# Patient Record
Sex: Male | Born: 1945 | Race: Black or African American | Hispanic: No | Marital: Single | Smoking: Never smoker
Health system: Southern US, Community
[De-identification: ages and names within clinical notes are randomized; demographics above are authoritative.]

---

## 2000-09-27 ENCOUNTER — Emergency Department (HOSPITAL_COMMUNITY): Admission: EM | Admit: 2000-09-27 | Discharge: 2000-09-28 | Payer: Self-pay | Admitting: Emergency Medicine

## 2001-07-07 ENCOUNTER — Emergency Department (HOSPITAL_COMMUNITY): Admission: EM | Admit: 2001-07-07 | Discharge: 2001-07-07 | Payer: Self-pay | Admitting: Emergency Medicine

## 2001-07-16 ENCOUNTER — Emergency Department (HOSPITAL_COMMUNITY): Admission: EM | Admit: 2001-07-16 | Discharge: 2001-07-16 | Payer: Self-pay | Admitting: Emergency Medicine

## 2002-08-16 ENCOUNTER — Emergency Department (HOSPITAL_COMMUNITY): Admission: EM | Admit: 2002-08-16 | Discharge: 2002-08-16 | Payer: Self-pay | Admitting: Emergency Medicine

## 2002-11-12 ENCOUNTER — Encounter: Payer: Self-pay | Admitting: Urology

## 2002-11-17 ENCOUNTER — Encounter (INDEPENDENT_AMBULATORY_CARE_PROVIDER_SITE_OTHER): Payer: Self-pay

## 2002-11-17 ENCOUNTER — Inpatient Hospital Stay (HOSPITAL_COMMUNITY): Admission: RE | Admit: 2002-11-17 | Discharge: 2002-11-20 | Payer: Self-pay | Admitting: Urology

## 2009-03-30 ENCOUNTER — Emergency Department (HOSPITAL_COMMUNITY): Admission: EM | Admit: 2009-03-30 | Discharge: 2009-03-30 | Payer: Self-pay | Admitting: Emergency Medicine

## 2009-10-28 ENCOUNTER — Ambulatory Visit: Payer: Self-pay | Admitting: Internal Medicine

## 2009-10-28 ENCOUNTER — Encounter: Payer: Self-pay | Admitting: Internal Medicine

## 2009-10-28 DIAGNOSIS — E119 Type 2 diabetes mellitus without complications: Secondary | ICD-10-CM

## 2009-10-28 DIAGNOSIS — J45909 Unspecified asthma, uncomplicated: Secondary | ICD-10-CM | POA: Insufficient documentation

## 2009-10-28 DIAGNOSIS — M25569 Pain in unspecified knee: Secondary | ICD-10-CM

## 2009-10-28 LAB — CONVERTED CEMR LAB
Blood Glucose, Fingerstick: 422
Hgb A1c MFr Bld: 12.3 %

## 2009-10-29 ENCOUNTER — Encounter: Payer: Self-pay | Admitting: Internal Medicine

## 2009-10-29 LAB — CONVERTED CEMR LAB
BUN: 20 mg/dL (ref 6–23)
Calcium: 9.6 mg/dL (ref 8.4–10.5)
Chloride: 101 meq/L (ref 96–112)
Creatinine, Ser: 0.95 mg/dL (ref 0.40–1.50)
Glucose, Bld: 345 mg/dL — ABNORMAL HIGH (ref 70–99)

## 2009-11-01 ENCOUNTER — Encounter: Payer: Self-pay | Admitting: Internal Medicine

## 2010-04-28 NOTE — Miscellaneous (Signed)
Summary: RELEASE OF INFORMATION FORM  RELEASE OF INFORMATION FORM   Imported By: Louretta Parma 11/04/2009 14:11:02  _____________________________________________________________________  External Attachment:    Type:   Image     Comment:   External Document

## 2010-04-28 NOTE — Miscellaneous (Signed)
Summary: PATIENT CONSENT FORM  PATIENT CONSENT FORM   Imported By: Louretta Parma 10/29/2009 12:13:55  _____________________________________________________________________  External Attachment:    Type:   Image     Comment:   External Document

## 2010-04-28 NOTE — Assessment & Plan Note (Signed)
Vital Signs:  Patient profile:   65 year old male Height:      70.75 inches (179.71 cm) Weight:      211.0 pounds (95.91 kg) BMI:     29.74 Temp:     98.5 degrees F (36.94 degrees C) oral Pulse rate:   106 / minute BP sitting:   154 / 92  (right arm)  Vitals Entered By: Stanton Kidney Ditzler RN (October 28, 2009 3:35 PM) Is Patient Diabetic? Yes Did you bring your meter with you today? No Pain Assessment Patient in pain? yes     Location: right knee Intensity: 8 Type: aching Onset of pain  past 3 weeks Nutritional Status BMI of 25 - 29 = overweight Nutritional Status Detail appetite good CBG Result 422  Have you ever been in a relationship where you felt threatened, hurt or afraid?denies   Does patient need assistance? Functional Status Self care Ambulation Normal Comments New pt to clinic - lost insurance and out of meds. Will use $4.00 meds at [facility] till he get [employer]. BP reck 4:30PM right arm 130/85 pulse 102.   History of Present Illness: 65 yo male with PMH of DM type II dx 7-8 yrs ago, asthma presents for medication refills and right knee pain.  For his asthma, he is using Advair 2 puffs daily and denies any SOB and has not had a flare in a long time.  For his DM, he was taking actos , metformin 500mg  bid , lipitor, never been on insulin before.  He takes advil or aspirin occasionally for headache. No numbness or tingling.  Been out of medications 2 months.  BS at home usually 120-125. Knee pain started about 1.5 wks ago.   Right knee  swells up a couple of days ago but it went down after rubbing alcohol.  No injury or trauma, hard to get up and feels stiff in the morning. After he moves around during the day, it feels better.  If he sits down for a period of time, it hurts when he gets up.    First BP on arrival was154/92.   Recheck BP was 135/84.  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   podiatry- 5 yrs ago  Dr. Chaya Jan   Allergic:  NKDA       Depression History:      The patient denies a depressed mood most of the day and a diminished interest in his usual daily activities.         Preventive Screening-Counseling & Management  Alcohol-Tobacco     Smoking Status: never  Allergies (verified): No Known Drug Allergies  Past History:  Past Medical History: Asthma -dx 12 yrs ago DM type II- dx 7-8 yrs ago Prostate cancer- Prostate removal 6 yrs ago by Dr. Margarite Gouge?  Family History: Mother: died of breast cancer Father: died of cancer , unknown type 3 siblings: DM type II  Social History: unemployed, live with wife, 2 healthy children, denies any smoking, alcohol, illicit drugs.  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  Physical Exam  General:  alert, well-developed, well-nourished, and well-hydrated.   Lungs:  normal respiratory effort, no intercostal retractions, no accessory muscle use, normal breath sounds, no crackles, and no wheezes.   Heart:  normal rate, regular rhythm, no murmur, no gallop, and no JVD.   Abdomen:  soft, non-tender, normal bowel sounds, no distention, no masses, and no guarding.   Msk:  normal ROM.  Right knee tenderness, no warmth or swelling, or erythema.  Passive ROM 70 degree, + crepitation. Pulses:  R radial normal and R popliteal normal.   Extremities:  no edema  Neurologic:  alert & oriented X3 and cranial  nerves II-XII intact.     Impression & Recommendations:  Problem # 1:  DM (ICD-250.00) Assessment Deteriorated  Uncontrolled DM type II.  CBG today 422 with HbA1c 12.3.  Patient ran out of his medications 2 months ago.  He stated that his HbA1c was in the 7 range when he saw Dr. Valentina Lucks earlier this year and was on Metformin 500mg  bid and Actos.  Patient does not wish to restart Actos.  We will restart Metformin 500mg  and titrate up to 1000mg  two times a day in 4 weeks.  Please see titrating dosage below.   Will also start Glipizide 10mg  by mouth daily Will wait to check his microalbumin once his blood sugar is adequately controlled. I will check his BMP today for basic labs and Cr while defer other lab tests since patient does not have insurance at this moment. Will get Medical records from his previous PCP.  His updated medication list for this problem includes:    Metformin Hcl 500 Mg Tabs (Metformin hcl) .Marland Kitchen... 1 tablet po with supper x 7 days, then 1 tablet with breakfast and supper x 1wk, 1 tablet with breakfast and 2 tablets with supper x1wk,then 2 tab by two times daily    Glipizide Xl 10 Mg Xr24h-tab (Glipizide) .Marland Kitchen... 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  Patient reports improving pain and swelling with alcohol rubs.  I advised patient to use heating or ice pads for pain and swelling of his knee.  He can also take tylenol or ibuprofen as needed for pain.  If symptoms do not improve, I will send for Xray for further evaluation.  Willl check his Cr level today to make sure his kidneys can tolerate NSAIDs.   Patient will follow up with me in 2 weeks  Orders: T-Basic Metabolic Panel [id number])  Problem # 3:  ASTHMA, UNSPECIFIED, UNSPECIFIED STATUS (ICD-493.90) Assessment: Unchanged Stable on Advair.   His lungs were clear to auscultate, no wheezes.  I gave him 1 sample of Advair and advised him to fill out medication assistance form online since patient does not have insurance and is not qualified for orange card.   May consider Albuterol if he cannot afford Advair.  His updated medication list for this problem includes:    Advair Diskus 250-50 Mcg/dose Aepb (Fluticasone-salmeterol) .Marland Kitchen... 1 inhalation two times a day  Complete Medication List: 1)  Advair Diskus 250-50 Mcg/dose Aepb (Fluticasone-salmeterol) .Marland Kitchen.. 1 inhalation two times a day 2)  Metformin Hcl 500 Mg Tabs (Metformin hcl) .Marland Kitchen.. 1 tablet po with supper x 7 days, then 1 tablet with breakfast and supper x 1wk, 1 tablet with breakfast and 2 tablets with supper x1wk,then 2 tab by two times  daily 3)  Glipizide Xl 10 Mg Xr24h-tab (Glipizide) .Marland Kitchen.. 1 tablet by mouth daily  Patient Instructions: 1)  1. Take Metformin as followed: 2)   Wk 1: 500mg  with supper 3)  Wk 2: 500mg  with breakfast and supper 4)  Wk 3: 500mg  with breakfast and 1000mg  with supper 5)  Wk 4: 1000 mg with breakfast and 1000mg  with supper 6)  2. Start taking Glipizide 10mg  1 tablet daily 7)  3. Use Advair for asthma  8)  4. Fill out medication assistance form online for Advair 9)  5. Take Tylenol or ibuprofen for knee pain as needed, can also try heating or ice pads 10)  6. Follow up with Dr. Anselm Jungling in 2 weeks Prescriptions: METFORMIN HCL 500 MG TABS (METFORMIN HCL) 1 tablet po with supper x 7 days, then 1 tablet with breakfast and supper x 1wk, 1 tablet with breakfast and 2 tablets with supper x1wk,then 2 tab by two times daily  #70 x 3   Entered and Authorized by:   Rosana Berger MD   Signed by:   Rosana Berger MD on 10/28/2009   Method used:   Print then Give to Patient   RxID:   [id number] GLIPIZIDE XL 10 MG XR24H-TAB (GLIPIZIDE) 1 tablet by mouth daily  #30 x 3   Entered and Authorized by:   Rosana Berger MD   Signed by:   Rosana Berger MD on 10/28/2009   Method  used:   Electronically to        [facility] [phone]* (retail)       [address]       [place], [place]  [address]       Ph: [phone]       Fax: [phone]   RxID:   [id number] GLIPIZIDE XL 10 MG XR24H-TAB (GLIPIZIDE) 1 tablet by mouth daily  #30 x 3   Entered and Authorized by:   Rosana Berger MD   Signed by:   Rosana Berger MD on 10/28/2009   Method used:   Electronically to        [facility] [phone]* (retail)       [address]       [place], [place]  [address]       Ph: [phone]       Fax: [phone]   RxID:   [id number] ADVAIR DISKUS 250-50 MCG/DOSE AEPB (FLUTICASONE-SALMETEROL) 1 inhalation two times a day  #1 x 0   Entered and Authorized by:   Rosana Berger MD   Signed by:   Rosana Berger MD on 10/28/2009   Method used:   Electronically to        [facility] [phone]* (retail)       [address]       [place], [place]  [address]       Ph: [phone]       Fax: [phone]   RxID:   [id number]  Process Orders Check Orders Results:     [facility]: ABN not required for this insurance Tests Sent for requisitioning (October 28, 2009 5:41 PM):     10/28/2009: [facility] -- T-Basic Metabolic Panel [id number] (signed)     Laboratory Results   Blood Tests   Date/Time Received: October 28, 2009 3:59 PM Date/Time Reported: Burke Keels  October 28, 2009 4:00 PM    HGBA1C: 12.3%   (Normal Range: Non-Diabetic - 3-6%   Control Diabetic - 6-8%)  CBG Random:: 422mg /dL  Comments: per patient, out of meds for approx 2 months    Appended Document:  I saw and examined Mr Molesworth with Dr Anselm Jungling. I agree with her note above. Mr Million does not have insurance and household income too much for orange card. Dr Anselm Jungling changed meds to $4 plan. Advair will be the only difficult med to obtain. No statin for now - get old records and check FLP soon. Dr Rogelia Boga

## 2010-06-09 ENCOUNTER — Encounter: Payer: Self-pay | Admitting: Internal Medicine

## 2010-06-09 DIAGNOSIS — C61 Malignant neoplasm of prostate: Secondary | ICD-10-CM | POA: Insufficient documentation

## 2010-06-11 LAB — GLUCOSE, CAPILLARY: Glucose-Capillary: 167 mg/dL — ABNORMAL HIGH (ref 70–99)

## 2010-08-01 ENCOUNTER — Inpatient Hospital Stay (INDEPENDENT_AMBULATORY_CARE_PROVIDER_SITE_OTHER)
Admission: RE | Admit: 2010-08-01 | Discharge: 2010-08-01 | Disposition: A | Discharge status: Home / Self Care | Payer: Self-pay | Source: Ambulatory Visit | Attending: Emergency Medicine | Admitting: Emergency Medicine

## 2010-08-01 DIAGNOSIS — S61409A Unspecified open wound of unspecified hand, initial encounter: Secondary | ICD-10-CM

## 2010-08-12 NOTE — Discharge Summary (Signed)
NAMEGREIG, ALTERGOTT                            ACCOUNT NO.:  [id number]   MEDICAL RECORD NO.:  [id number]                   PATIENT TYPE:  INP   LOCATION:  0359                                 FACILITY:  [facility]   PHYSICIAN:  Valetta Fuller, M.D.               DATE OF BIRTH:  11/05/1945   DATE OF ADMISSION:  11/17/2002  DATE OF DISCHARGE:  11/20/2002                                 DISCHARGE SUMMARY   DISCHARGE DIAGNOSES:  1. 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  2. Postoperative anemia.  3. Hypertension.  4. Type 2 diabetes mellitus.   PROCEDURE PERFORMED:  Pelvic lymph node dissection and radical retropubic  prostatectomy on November 17, 2002.   HOSPITAL COURSE:  [name] is a 65 year old male.  He had elevated PSA in  the 4-5 range with a markedly reduced PSA-2 gradient.  Biopsies revealed  left-sided adenocarcinoma with a Gleason score of 4+3=7.  The patient  underwent extensive counseling and elected to have a pelvic lymph node  dissection, radical retropubic prostatectomy.  Of note, the patient's  preoperative hemoglobin was 11.6.   The patient underwent relatively uneventful pelvic lymph node dissection and  radical retropubic prostatectomy.  There was no evidence of disease outside  the prostate.  Estimated blood loss for his surgery was approximately 1000  mL.  In the immediate postoperative period he did have a little emesis.  His  initial postoperative hemoglobin was 8.9.  He continued to be  hemodynamically stable.  His urine output remained excellent and JP drainage  was minimal.  On postoperative day one his hemoglobin was down to 8.0 and  again, he remained hemodynamically stable without any lightheadedness or  dizziness.  The patient continued to have minimal JP output.  The patient  was able to resume general diet by postoperative day two.  He had low grade  fever under 101 thought to be due to atelectasis.  On postoperative day  three his  hemoglobin had drifted down to 6.9.  The patient again was  somewhat anemic preoperatively with a normal MCV, but a hemoglobin of  approximately 11.  We felt that given his preoperative anemia that he may be  slow to produce red blood cells and we felt it would probably be safer given  his cardiac history to go ahead and transfuse 2 units of packed blood cells.  That was done without complication.  The patient's follow-up pathology  revealed bilateral Gleason 4+3=7 prostate cancer.  There was no evidence of  margin positive or capsular extension.   DISPOSITION:  The patient was discharged home with Colace, Vicodin, and  iron.  He will have a CBC drawn in a couple of days as an outpatient.  He  will come back in one week for staple removal and in approximately 10 days  for Foley catheter removal.  Valetta Fuller, M.D.    DSG/MEDQ  D:  12/16/2002  T:  12/16/2002  Job:  [id number]

## 2010-08-12 NOTE — H&P (Signed)
NAMEJEROL, [name]                            ACCOUNT NO.:  [id number]   MEDICAL RECORD NO.:  [id number]                   PATIENT TYPE:  INP   LOCATION:  X006                                 FACILITY:  [facility]   PHYSICIAN:  Valetta Fuller, M.D.               DATE OF BIRTH:  [birth year]   DATE OF ADMISSION:  11/17/2002  DATE OF DISCHARGE:                                HISTORY & PHYSICAL   CHIEF COMPLAINT:  Clinical stage T1C adenocarcinoma of the prostate for  pelvic lymph node dissection and radical retropubic prostatectomy.   HISTORY OF PRESENT ILLNESS:  [name] is a 65 year old male.  During a  routine physical exam, he was noted to have a PSA in the 4 to 5 range.  His  PSA2 reading was only 7%.  We recommended further evaluation.  Digital  rectal exam was unremarkable and his prostate was relatively small.  The  patient did undergo ultrasound and biopsy.  This revealed a 20g prostate.  The left-sided biopsies were positive for a Gleason's 4+3=7 adenocarcinoma  of the prostate.  While his PSA was quite low, the Gleason's 4 component of  his prostate cancer is potentially more worrisome.  He is also young and has  relatively good health.  We recommended pelvic lymph node dissection and  radical retropubic prostatectomy.  The patient appeared to understand those  advantages and disadvantages and elected to have surgical intervention.   PAST MEDICAL HISTORY:  Significant for diabetes mellitus.  This is non  insulin requiring.  He is on Glucophage, Altace, Avandia, Glucotrol and is  also an asthmatic that uses Advair and albuterol.  He has no drug allergies.   PAST SURGICAL HISTORY:  Unremarkable.   SOCIAL HISTORY:  He is a nonsmoker.   FAMILY HISTORY:  Positive for diabetes and negative for prostate cancer.   REVIEW OF SYSTEMS:  Relatively unremarkable with no significant voiding or  erectile difficulties.   PHYSICAL EXAMINATION:  GENERAL:  He is a well-developed,  well-nourished  male.  VITAL SIGNS:  His blood pressure was 164/92 with a pulse of 80.  He is  afebrile.  HEENT:  Neck is without masses or jugular venous distention.  CHEST:  Clear to auscultation.  ABDOMEN:  Soft and flat.  GU:  External genitalia is within normal limits with a 1+ prostate.  EXTREMITIES:  No abnormality.   LABORATORY DATA:  BMET is within normal limits.  Hemoglobin was somewhat  reduced at 11.6.  He appeared to have a mild normocytic anemia.    ASSESSMENT OF CLINICAL STATE:  T1C adenocarcinoma of the prostate.  The  patient is to have pelvic lymph node dissection, radical retropubic  prostatectomy today. Hopefully he will be admitted for routine postoperative  care.  Valetta Fuller, M.D.    DSG/MEDQ  D:  11/17/2002  T:  11/17/2002  Job:  [id number]   cc:   [name], M.D.  [address]  [place]  [place] [address]  Fax: [phone]

## 2010-08-12 NOTE — Op Note (Signed)
[name], [name]                            ACCOUNT NO.:  [id number]   MEDICAL RECORD NO.:  [id number]                   PATIENT TYPE:  INP   LOCATION:  Z610                                 FACILITY:  [facility]   PHYSICIAN:  Valetta Fuller, M.D.               DATE OF BIRTH:  Jul 11, 1945   DATE OF PROCEDURE:  11/17/2002  DATE OF DISCHARGE:                                 OPERATIVE REPORT   PREOPERATIVE DIAGNOSIS:  Clinical stage T1C adenocarcinoma of the prostate.   POSTOPERATIVE DIAGNOSES:  Clinical stage T1C adenocarcinoma of the prostate.   PROCEDURE:  Bilateral pelvic lymph node dissection and radical retropubic  prostatectomy.   SURGEON:  Valetta Fuller, M.D.   ASSISTANT:  [name], M.D.   ANESTHESIA:  General endotracheal.   INDICATIONS FOR PROCEDURE:  [name] is a 65 year old male who was recently  diagnosed with clinical stage T1C adenocarcinoma of the prostate. His PSA  was in the 4-5 range with a markedly reduced PSA 2 reading. He ended up  having left sided Gleason 7 (4+3 adenocarcinoma of the prostate). The  patient underwent extensive counseling with regard to treatment options. He  does have type 2 diabetes and mild hypertension but has really been well  managed medically and appears to be in otherwise relatively good health.  Again, he and the family and I had an extensive discussion with regard to  treatment options. He elected to have a radical retropubic prostatectomy,  pelvic lymph node dissection. He appeared to understand the advantages,  disadvantages and potential complications of this problem.   TECHNIQUE:  The patient was brought to the operating room where he had  successful induction of general endotracheal anesthesia. He was placed in  the supine position with a slight break in the table. He was then prepped  and draped in the usual manner. Under sterile conditions, a Foley catheter  was placed. A standard lower midline incision was  performed. The retropubic  space was entered with blunt dissective technique and an Omni retractor was  used for visualization. Bilateral pelvic lymph node dissections were then  done of the obturator packets. The obturator nerves were identified and  spared bilaterally. Small vessels and lymphatics were clipped or cauterized.  No palpably enlarged or visibly enlarged nodes were encountered.   Attention was then turned towards the prostatectomy. The endopelvic fascia  was opened bilaterally. Careful blunt dissection of the under surface of the  pubic symphysis revealed the bilateral puboprostatic ligaments which were  sharply dissected. We were then able to pass a large right angled clamp  between the urethra and dorsal vein complex which was doubly ligated and  then suture ligated. It was then transected. The underlying urethral stump  was clearly identified. Some of the lateral neurovascular bundle tissue was  dissected off the urethral stump which was then carefully encircled and  transected anteriorly. The  Foley catheter was then identified and brought  out through the wound. The posterior wall of the urethra was then transected  again leaving a nice urethral stump. We had to sharply dissect some  rectourethralis tissue and then the posterior plane was very easily  established. We did not palpate any obvious tumor near the neurovascular  bundle and felt it appropriate to consider him for bilateral nerve sparing  prostatectomy. The endopelvic fascia along the lateral aspect of the  prostate was sharply dissected on both sides. We were then able to identify  the seminal vesicles and vas in the midline. Using that as our guide, we  were able to take down the lateral pedicles of the prostate primarily  utilizing Hem-o-lok clips. We then dissected out the vas deferens and  seminal vesicle posteriorly. We then turned our attention to the bladder  neck.   The bladder neck dissection was  done with a combination of sharp and blunt  dissective technique preserving the circular fibers of the bladder neck.  Posteriorly we were then able to again identify the vas which were clipped  and transected in the seminal vesicles which were taken out with the  specimen. The bladder neck did not require reconstruction but the mucosa was  everted with several 4-0 Vicryl sutures. A urethral sound was placed and the  urethral stump was identified. Five separate 2-0 Vicryl double armed sutures  were utilized in an anatomic manner. They were then placed in the bladder  neck in corresponding positions and this was done over a 22 [place] Foley.  The anastomosis was then tied and irrigated. No obvious leakage occurred.  The whole pelvis was copiously irrigated. A Jackson-Pratt drain was placed  in the retropubic space. The fascia was closed with #1 PDS. The skin was  closed with clips. The Jackson-Pratt was left to bulb suction and the  catheter was left to gravity drainage. Marcaine was infiltrated in the  wound. The patient appeared to tolerate the procedure well. Sponge and  needle counts were correct. Estimated blood loss was approximately 1000 mL.                                               Valetta Fuller, M.D.    DSG/MEDQ  D:  11/17/2002  T:  11/17/2002  Job:  [id number]   cc:   Thora Lance, M.D.  [address]  [place]  [place] [address]  Fax: [phone]

## 2012-12-16 ENCOUNTER — Encounter (HOSPITAL_COMMUNITY): Payer: Self-pay | Admitting: Emergency Medicine

## 2012-12-16 ENCOUNTER — Emergency Department (HOSPITAL_COMMUNITY)
Admission: EM | Admit: 2012-12-16 | Discharge: 2012-12-16 | Disposition: A | Discharge status: Home / Self Care | Payer: Medicare HMO | Attending: Emergency Medicine | Admitting: Emergency Medicine

## 2012-12-16 DIAGNOSIS — Z79899 Other long term (current) drug therapy: Secondary | ICD-10-CM | POA: Insufficient documentation

## 2012-12-16 DIAGNOSIS — J45909 Unspecified asthma, uncomplicated: Secondary | ICD-10-CM | POA: Insufficient documentation

## 2012-12-16 DIAGNOSIS — Y9389 Activity, other specified: Secondary | ICD-10-CM | POA: Insufficient documentation

## 2012-12-16 DIAGNOSIS — S90569A Insect bite (nonvenomous), unspecified ankle, initial encounter: Secondary | ICD-10-CM | POA: Insufficient documentation

## 2012-12-16 DIAGNOSIS — Y9289 Other specified places as the place of occurrence of the external cause: Secondary | ICD-10-CM | POA: Insufficient documentation

## 2012-12-16 DIAGNOSIS — W57XXXA Bitten or stung by nonvenomous insect and other nonvenomous arthropods, initial encounter: Secondary | ICD-10-CM

## 2012-12-16 DIAGNOSIS — E119 Type 2 diabetes mellitus without complications: Secondary | ICD-10-CM | POA: Insufficient documentation

## 2012-12-16 LAB — GLUCOSE, CAPILLARY: Glucose-Capillary: 292 mg/dL — ABNORMAL HIGH (ref 70–99)

## 2012-12-16 NOTE — ED Notes (Signed)
Bitten by something last Tuesday on left thigh now red and swollen

## 2012-12-16 NOTE — ED Notes (Signed)
Provider at the bedside to mark reddened area on leg.

## 2012-12-16 NOTE — ED Provider Notes (Signed)
CSN: [id number]     Arrival date & time 12/16/12  0801 History   First MD Initiated Contact with Patient 12/16/12 [phone]     Chief Complaint  Patient presents with  . Leg Pain   (Consider location/radiation/quality/duration/timing/severity/associated sxs/prior Treatment) The history is provided by the patient. No language interpreter was used.  Tramel Westbrook is a 67 y/o M with PMHx of asthma and DM presenting to the ED with bite to the inner aspect of his left thigh. Patient reported that he was working in the yard last Tuesday, wearing long pants, stated that he noticed a red lesion to the inner aspect of the left thigh. Reported that there was no pain or itching. Reported that he applied rubbing alcohol. Patient reported that it was red at first, but has now changed to a purplish color with red dots on it. Patient reported that there is a mild stinging sensation with radiation to the left hip. Reported that his sugars are controlled, reported that it was 125 this morning. Denied fever, chills, drainage, bleeding, numbness, tingling, pain with palpation, loss of sensation, weakness, pain with walking, chest pain, shortness of breath, difficulty breathing. PCP Dr. Kirby Funk  Past Medical History  Diagnosis Date  . Asthma   . Diabetes mellitus    History reviewed. No pertinent past surgical history. Family History  Problem Relation Age of Onset  . Cancer Father   . Diabetes Sister   . Diabetes Brother   . Cancer Mother    History  Substance Use Topics  . Smoking status: Never Smoker   . Smokeless tobacco: Not on file  . Alcohol Use: No    Review of Systems  Constitutional: Negative for fever and chills.  HENT: Negative for neck pain and neck stiffness.   Eyes: Negative for visual disturbance.  Respiratory: Negative for chest tightness and shortness of breath.   Cardiovascular: Negative for chest pain.  Gastrointestinal: Negative for nausea, vomiting and abdominal pain.   Genitourinary: Negative for decreased urine volume and difficulty urinating.  Skin: Positive for rash.  Neurological: Negative for dizziness, weakness, numbness and headaches.  All other systems reviewed and are negative.    Allergies  Review of patient's allergies indicates no known allergies.  Home Medications   Current Outpatient Rx  Name  Route  Sig  Dispense  Refill  . Fluticasone-Salmeterol (ADVAIR DISKUS) 250-50 MCG/DOSE AEPB   Inhalation   Inhale 1 puff into the lungs every 12 (twelve) hours.           Marland Kitchen glipiZIDE (GLUCOTROL) 10 MG 24 hr tablet   Oral   Take 10 mg by mouth daily.           . metFORMIN (GLUCOPHAGE) 500 MG tablet   Oral   Take 500 mg by mouth 2 (two) times daily with a meal.         . pioglitazone (ACTOS) 30 MG tablet   Oral   Take 30 mg by mouth daily.          BP 142/83  Pulse 84  Temp(Src) 98.6 F (37 C) (Oral)  Resp 18  SpO2 97% Physical Exam  Nursing note and vitals reviewed. Constitutional: He is oriented to person, place, and time. He appears well-developed and well-nourished. No distress.  HENT:  Head: Normocephalic and atraumatic.  Eyes: Conjunctivae and EOM are normal. Pupils are equal, round, and reactive to light. Right eye exhibits no discharge. Left eye exhibits no discharge.  Neck: Normal range of  motion. Neck supple.  Cardiovascular: Normal rate, regular rhythm and normal heart sounds.  Exam reveals no friction rub.   No murmur heard. Pulses:      Radial pulses are 2+ on the right side, and 2+ on the left side.       Dorsalis pedis pulses are 2+ on the right side, and 2+ on the left side.  Pulmonary/Chest: Effort normal and breath sounds normal. No respiratory distress. He has no wheezes. He has no rales.  Lymphadenopathy:    He has no cervical adenopathy.  Neurological: He is alert and oriented to person, place, and time. He exhibits normal muscle tone. Coordination normal.  Strength 5+/5+ to lower extremities  bilaterally with resistance applied, equal distribution identified.  Sensation intact  Skin: Skin is warm and dry. He is not diaphoretic.  Approximately 3 cm x 3 cm lesion to medial aspect of proximal third of left thigh with ecchymotic underlying discoloration with scattered miniscule erythematous dots. Negative drainage or bleeding. Negative pain upon palpation. Negative warmth. Negative abscess. Negative surrounding erythema.   Psychiatric: He has a normal mood and affect. His behavior is normal. Thought content normal.    ED Course  Procedures (including critical care time)  8:35 AM Spoke with Dr. Lourena Simmonds regarding case, recommended to mark the site and have patient follow-up in 2 days with PCP or ED. Did not recommend administration of antibiotics.   Labs Review Labs Reviewed  GLUCOSE, CAPILLARY - Abnormal; Notable for the following:    Glucose-Capillary 292 (*)    All other components within normal limits   Imaging Review No results found.  MDM   1. Bug bite     Patient presenting to the ED with bite to the inner aspect of his left thigh that he noticed on Tuesday with change in presentation.  Alert and oriented. Negative LAD. Small 3 cm x 3 cm lesion with purple discoloration underlying with red dots. Negative drainage, erythema, inflammation, swelling, raised lesions, blanching, pain upon palpation.  Patient stable, afebrile. Suspicion to be a bug bite of unknown origin. Marked the site and discussed with patient to monitor and that if the lesion is to change or go past the line of demarcation to report back to the ED. Discussed with patient to apply heat. Discussed with patient to follow-up with PCP or ED within 2 days for re-check. discussed with patient to continue to monitor symptoms and if symptoms are to worsen or change to report back to the ED - strict return instructions given. Patient agreed to plan of care, understood, all questions answered.     Raymon Mutton,  PA-C 12/18/12 1206

## 2012-12-19 NOTE — ED Provider Notes (Signed)
Medical screening examination/treatment/procedure(s) were performed by non-physician practitioner and as supervising physician I was immediately available for consultation/collaboration.   Nelia Shi, MD 12/19/12 [phone]

## 2013-02-03 ENCOUNTER — Encounter (HOSPITAL_COMMUNITY): Payer: Self-pay | Admitting: Emergency Medicine

## 2013-02-03 ENCOUNTER — Emergency Department (HOSPITAL_COMMUNITY)
Admission: EM | Admit: 2013-02-03 | Discharge: 2013-02-03 | Disposition: A | Discharge status: Home / Self Care | Payer: Medicare HMO | Attending: Emergency Medicine | Admitting: Emergency Medicine

## 2013-02-03 DIAGNOSIS — Z79899 Other long term (current) drug therapy: Secondary | ICD-10-CM | POA: Insufficient documentation

## 2013-02-03 DIAGNOSIS — E119 Type 2 diabetes mellitus without complications: Secondary | ICD-10-CM | POA: Insufficient documentation

## 2013-02-03 DIAGNOSIS — K089 Disorder of teeth and supporting structures, unspecified: Secondary | ICD-10-CM | POA: Insufficient documentation

## 2013-02-03 DIAGNOSIS — K0889 Other specified disorders of teeth and supporting structures: Secondary | ICD-10-CM

## 2013-02-03 DIAGNOSIS — J45909 Unspecified asthma, uncomplicated: Secondary | ICD-10-CM | POA: Insufficient documentation

## 2013-02-03 DIAGNOSIS — Z7982 Long term (current) use of aspirin: Secondary | ICD-10-CM | POA: Insufficient documentation

## 2013-02-03 NOTE — ED Notes (Signed)
Right upper tooth pain for past 2 days. No swelling noted. Pain when eating

## 2013-02-03 NOTE — ED Provider Notes (Signed)
CSN: [id number]     Arrival date & time 02/03/13  8295 History   First MD Initiated Contact with Patient 02/03/13 0756     No chief complaint on file.  (Consider location/radiation/quality/duration/timing/severity/associated sxs/prior Treatment) HPI Comments: [name] is a 67 y.o. Male who presents for evaluation of toothache. He has an area of plaque cracked and chipped and this caused a tooth to become loose. He has not had a dental care in many years. He denies fever, chills, nausea, vomiting, weakness, or dizziness. There are no other known modifying factors.  The history is provided by the patient.    Past Medical History  Diagnosis Date  . Asthma   . Diabetes mellitus    No past surgical history on file. Family History  Problem Relation Age of Onset  . Cancer Father   . Diabetes Sister   . Diabetes Brother   . Cancer Mother    History  Substance Use Topics  . Smoking status: Never Smoker   . Smokeless tobacco: Not on file  . Alcohol Use: No    Review of Systems  All other systems reviewed and are negative.    Allergies  Review of patient's allergies indicates no known allergies.  Home Medications   Current Outpatient Rx  Name  Route  Sig  Dispense  Refill  . aspirin 325 MG tablet   Oral   Take 325 mg by mouth daily as needed for moderate pain.         Marland Kitchen glipiZIDE (GLUCOTROL) 10 MG 24 hr tablet   Oral   Take 10 mg by mouth daily.           . metFORMIN (GLUCOPHAGE) 500 MG tablet   Oral   Take 500 mg by mouth 2 (two) times daily with a meal.         . pioglitazone (ACTOS) 30 MG tablet   Oral   Take 30 mg by mouth daily.          There were no vitals taken for this visit. Physical Exam  Constitutional: He is oriented to person, place, and time. He appears well-developed and well-nourished.  HENT:  Head: Atraumatic.  He has had numerous extractions. Lower jaw, with the lateral incisors, connected by a plaque bridge that measures 2-1/2 cm.  This bridge is cracked in the middle, and the right lateral incisor is loose. There is no associated drainage or bleeding  Eyes: Conjunctivae and EOM are normal. Pupils are equal, round, and reactive to light.  Cardiovascular: Normal rate.   Pulmonary/Chest: Effort normal.  Neurological: He is alert and oriented to person, place, and time.  Skin: Skin is warm and dry.  Psychiatric: He has a normal mood and affect. His behavior is normal.    ED Course  Procedures (including critical care time) Labs Review Labs Reviewed - No data to display Imaging Review No results found.  EKG Interpretation   None       MDM   1. Toothache    Toothache or a hypertrophic plaque development, which has become unstable. No evidence for acute infection. Patient needs assessment, treatment by a dental specialist. He is referred to the on-call dentist. By convention, I expect that he will be seen today or tomorrow.  Nursing Notes Reviewed/ Care Coordinated, and agree without changes. Applicable Imaging Reviewed.  Interpretation of Laboratory Data incorporated into ED treatment   Plan: Home Medications- OTC analgesia; Home Treatments and Observation- rest, soft foods; return here if  the recommended treatment, does not improve the symptoms; Recommended follow up- Dental f/u asap      Flint Melter, MD 02/03/13 [phone]

## 2014-02-03 ENCOUNTER — Emergency Department (HOSPITAL_COMMUNITY): Payer: No Typology Code available for payment source

## 2014-02-03 ENCOUNTER — Emergency Department (HOSPITAL_COMMUNITY)
Admission: EM | Admit: 2014-02-03 | Discharge: 2014-02-03 | Disposition: A | Discharge status: Home / Self Care | Payer: No Typology Code available for payment source | Attending: Emergency Medicine | Admitting: Emergency Medicine

## 2014-02-03 ENCOUNTER — Encounter (HOSPITAL_COMMUNITY): Payer: Self-pay | Admitting: Emergency Medicine

## 2014-02-03 DIAGNOSIS — S6992XA Unspecified injury of left wrist, hand and finger(s), initial encounter: Secondary | ICD-10-CM

## 2014-02-03 DIAGNOSIS — E119 Type 2 diabetes mellitus without complications: Secondary | ICD-10-CM | POA: Diagnosis not present

## 2014-02-03 DIAGNOSIS — Z79899 Other long term (current) drug therapy: Secondary | ICD-10-CM | POA: Diagnosis not present

## 2014-02-03 DIAGNOSIS — Z7982 Long term (current) use of aspirin: Secondary | ICD-10-CM | POA: Insufficient documentation

## 2014-02-03 DIAGNOSIS — Y9389 Activity, other specified: Secondary | ICD-10-CM | POA: Insufficient documentation

## 2014-02-03 DIAGNOSIS — Y998 Other external cause status: Secondary | ICD-10-CM | POA: Insufficient documentation

## 2014-02-03 DIAGNOSIS — Y92410 Unspecified street and highway as the place of occurrence of the external cause: Secondary | ICD-10-CM | POA: Diagnosis not present

## 2014-02-03 DIAGNOSIS — J45909 Unspecified asthma, uncomplicated: Secondary | ICD-10-CM | POA: Diagnosis not present

## 2014-02-03 DIAGNOSIS — S39012A Strain of muscle, fascia and tendon of lower back, initial encounter: Secondary | ICD-10-CM | POA: Insufficient documentation

## 2014-02-03 DIAGNOSIS — S8991XA Unspecified injury of right lower leg, initial encounter: Secondary | ICD-10-CM | POA: Insufficient documentation

## 2014-02-03 MED ORDER — TRAMADOL HCL 50 MG PO TABS: 50.0000 mg | ORAL_TABLET | Freq: Four times a day (QID) | ORAL | Status: DC | PRN
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
## 2014-02-03 NOTE — Discharge Instructions (Signed)
Take Tramadol as needed for pain. Refer to attached documents for more information. Return to the ED with worsening or concerning symptoms.  °
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
## 2014-07-05 ENCOUNTER — Encounter (HOSPITAL_COMMUNITY): Payer: Self-pay | Admitting: Emergency Medicine

## 2014-07-05 ENCOUNTER — Emergency Department (HOSPITAL_COMMUNITY)
Admission: EM | Admit: 2014-07-05 | Discharge: 2014-07-05 | Disposition: A | Discharge status: Home / Self Care | Payer: Commercial Managed Care - HMO | Attending: Emergency Medicine | Admitting: Emergency Medicine

## 2014-07-05 DIAGNOSIS — J45909 Unspecified asthma, uncomplicated: Secondary | ICD-10-CM | POA: Insufficient documentation

## 2014-07-05 DIAGNOSIS — L03811 Cellulitis of head [any part, except face]: Secondary | ICD-10-CM | POA: Diagnosis not present

## 2014-07-05 DIAGNOSIS — Z79899 Other long term (current) drug therapy: Secondary | ICD-10-CM | POA: Insufficient documentation

## 2014-07-05 DIAGNOSIS — L988 Other specified disorders of the skin and subcutaneous tissue: Secondary | ICD-10-CM | POA: Diagnosis present

## 2014-07-05 DIAGNOSIS — E119 Type 2 diabetes mellitus without complications: Secondary | ICD-10-CM | POA: Diagnosis not present

## 2014-07-05 DIAGNOSIS — Z7982 Long term (current) use of aspirin: Secondary | ICD-10-CM | POA: Diagnosis not present

## 2014-07-05 MED ORDER — TRAMADOL HCL 50 MG PO TABS: 50.0000 mg | ORAL_TABLET | Freq: Four times a day (QID) | ORAL | Status: AC | PRN

## 2014-07-05 MED ORDER — SULFAMETHOXAZOLE-TRIMETHOPRIM 800-160 MG PO TABS: 1.0000 | ORAL_TABLET | Freq: Two times a day (BID) | ORAL | Status: AC

## 2014-07-05 NOTE — ED Notes (Signed)
Pt is in stable condition upon d/c and ambulates from ED. 

## 2014-07-05 NOTE — Discharge Instructions (Signed)
Please use medication as tract of her entire course of therapy. Monitor for new or worsening signs or symptoms and seek care if any present. Please follow-up with dermatology in one week for recheck of symptoms. Act information has been attached. Cannot be seen by her dermatologist please follow-up with your primary care provider

## 2014-07-05 NOTE — ED Provider Notes (Signed)
CSN: [id number]     Arrival date & time 07/05/14  1004 History   First MD Initiated Contact with Patient 07/05/14 1058     Chief Complaint  Patient presents with  . Hair/Scalp Problem    HPI  69 year old male presents today with a lesion to his scalp. Patient reports that one week ago he noticed a small "lump" to his left postauricular scalp. He reports it was slightly tender to touch and has progressively gotten worse with "scabbing". Patient denies history of the same, no history of skin infections, her contact with anyone with known MRSA. She lives at home. He denies fever, radiation of pain, swollen stiff neck, or any surrounding swelling erythema. Patient reports he's been feeling well, no nausea, vomiting, fevers, abdominal pain or any other concerning signs or symptoms. She reports that he does have headaches associated with the pain worse with palpation. 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  Past Medical History  Diagnosis Date  . Asthma   . Diabetes mellitus    History reviewed. No pertinent past surgical history. Family History  Problem Relation Age of Onset  . Cancer Father   . Diabetes Sister   . Diabetes Brother   . Cancer Mother    History  Substance Use Topics  . Smoking status: Never Smoker   . Smokeless tobacco: Not on file  . Alcohol Use: No    Review of Systems  All other systems reviewed and are negative.   Allergies  Review of patient's allergies indicates no known allergies.  Home Medications   Prior to Admission medications   Medication Sig Start Date End Date Taking? Authorizing Provider  aspirin 325 MG tablet Take 325 mg by mouth daily as needed for moderate pain.    Historical Provider, MD  Aspirin-Acetaminophen (GOODYS BODY PAIN PO) Take 1 packet by mouth.    Historical Provider, MD  glipiZIDE (GLUCOTROL) 10 MG 24 hr tablet Take 10 mg by mouth daily.      Historical Provider, MD  metFORMIN (GLUCOPHAGE) 500 MG tablet  Take 500 mg by mouth 2 (two) times daily with a meal.    Historical Provider, MD  pioglitazone (ACTOS) 30 MG tablet Take 30 mg by mouth daily. 11/26/12   Historical Provider, MD  traMADol (ULTRAM) 50 MG tablet Take 1 tablet (50 mg total) by mouth every 6 (six) hours as needed. 02/03/14   Kaitlyn Szekalski, PA-C   BP 146/89 mmHg  Pulse 85  Temp(Src) 98.2 F (36.8 C)  Resp 18  SpO2 97% Physical Exam  Constitutional: He is oriented to person, place, and time. He appears well-developed and well-nourished.  HENT:  Head: Normocephalic and atraumatic.  Eyes: Pupils are equal, round, and reactive to light.  Neck: Normal range of motion. Neck supple. No JVD present. No tracheal deviation present. No thyromegaly present.  Cardiovascular: Normal rate, regular rhythm, normal heart sounds and intact distal pulses.  Exam reveals no gallop and no friction rub.   No murmur heard. Pulmonary/Chest: Effort normal and breath sounds normal. No stridor. No respiratory distress. He has no wheezes. He has no rales. He exhibits no tenderness.  Musculoskeletal: Normal range of motion.  Lymphadenopathy:    He has no cervical adenopathy.  Neurological: He is alert and oriented to person, place, and time. Coordination normal.  Skin: Skin is warm and dry.  See attached photos; area tender to palpation no surrounding cellulitis, erythema, discharge, swelling skin without other lesions or abnormal pigmentation.  Psychiatric: He has a normal mood and affect. His behavior is normal. Judgment and thought content normal.  Nursing note and vitals reviewed.      ED Course  Procedures (including critical care time) Labs Review Labs Reviewed - No data to display  Imaging Review No results found.   EKG Interpretation None     MDM   Final diagnoses:  Cellulitis of head except face    Assessment/Plan: Patient's presentation likely noninflammatory skin infection. Patient reports that it started as a small lump  and has slowly progressed over time. He denies stomach symptoms that would indicate a further spread. No significant sun exposure history of abnormal moles, with the initial presentation and progression this is less likely related to skin malignancy. Patient was discharged home on antibiotics and pain medicines medication to be used for breakthrough pain. He is instructed to complete the entire course in follow-up with a dermatologist in one week for further evaluation and management. He is instructed to monitor for new or worsening signs or symptoms and return immediately if any presented. Patient understood and agreed to the plan today.      Okey Regal, PA-C 07/05/14 1222  Carmin Muskrat, MD 07/05/14 [phone]

## 2014-07-05 NOTE — ED Notes (Signed)
p-t. Stated, I have a little area on my left side of my head that itches and hurts , its been there for a week.

## 2014-07-09 ENCOUNTER — Emergency Department (HOSPITAL_COMMUNITY): Payer: Commercial Managed Care - HMO

## 2014-07-09 ENCOUNTER — Encounter (HOSPITAL_COMMUNITY): Payer: Self-pay | Admitting: Emergency Medicine

## 2014-07-09 ENCOUNTER — Emergency Department (HOSPITAL_COMMUNITY)
Admission: EM | Admit: 2014-07-09 | Discharge: 2014-07-09 | Disposition: A | Discharge status: Home / Self Care | Payer: Commercial Managed Care - HMO | Attending: Emergency Medicine | Admitting: Emergency Medicine

## 2014-07-09 DIAGNOSIS — L739 Follicular disorder, unspecified: Secondary | ICD-10-CM | POA: Diagnosis not present

## 2014-07-09 DIAGNOSIS — E119 Type 2 diabetes mellitus without complications: Secondary | ICD-10-CM | POA: Diagnosis not present

## 2014-07-09 DIAGNOSIS — J45909 Unspecified asthma, uncomplicated: Secondary | ICD-10-CM | POA: Diagnosis not present

## 2014-07-09 DIAGNOSIS — Z79899 Other long term (current) drug therapy: Secondary | ICD-10-CM | POA: Insufficient documentation

## 2014-07-09 DIAGNOSIS — L089 Local infection of the skin and subcutaneous tissue, unspecified: Secondary | ICD-10-CM | POA: Diagnosis present

## 2014-07-09 DIAGNOSIS — R221 Localized swelling, mass and lump, neck: Secondary | ICD-10-CM | POA: Diagnosis not present

## 2014-07-09 DIAGNOSIS — L03811 Cellulitis of head [any part, except face]: Secondary | ICD-10-CM | POA: Insufficient documentation

## 2014-07-09 LAB — BASIC METABOLIC PANEL
ANION GAP: 10 (ref 5–15)
BUN: 22 mg/dL (ref 6–23)
CALCIUM: 8.8 mg/dL (ref 8.4–10.5)
CO2: 23 mmol/L (ref 19–32)
CREATININE: 1.14 mg/dL (ref 0.50–1.35)
Chloride: 101 mmol/L (ref 96–112)
GFR calc non Af Amer: 64 mL/min — ABNORMAL LOW (ref >90)
GFR, EST AFRICAN AMERICAN: 74 mL/min — AB (ref >90)
Glucose, Bld: 277 mg/dL — ABNORMAL HIGH (ref 70–99)
Potassium: 4.5 mmol/L (ref 3.5–5.1)
SODIUM: 134 mmol/L — AB (ref 135–145)

## 2014-07-09 LAB — CBC WITH DIFFERENTIAL/PLATELET
Basophils Absolute: 0 10*3/uL (ref 0.0–0.1)
Basophils Relative: 0 % (ref 0–1)
EOS ABS: 0.2 10*3/uL (ref 0.0–0.7)
EOS PCT: 5 % (ref 0–5)
HEMATOCRIT: 34.9 % — AB (ref 39.0–52.0)
HEMOGLOBIN: 11.8 g/dL — AB (ref 13.0–17.0)
LYMPHS ABS: 0.8 10*3/uL (ref 0.7–4.0)
LYMPHS PCT: 24 % (ref 12–46)
MCH: 30.3 pg (ref 26.0–34.0)
MCHC: 33.8 g/dL (ref 30.0–36.0)
MCV: 89.7 fL (ref 78.0–100.0)
MONO ABS: 0.3 10*3/uL (ref 0.1–1.0)
MONOS PCT: 9 % (ref 3–12)
Neutro Abs: 2 10*3/uL (ref 1.7–7.7)
Neutrophils Relative %: 62 % (ref 43–77)
Platelets: 159 10*3/uL (ref 150–400)
RBC: 3.89 MIL/uL — ABNORMAL LOW (ref 4.22–5.81)
RDW: 12 % (ref 11.5–15.5)
WBC: 3.3 10*3/uL — AB (ref 4.0–10.5)

## 2014-07-09 LAB — I-STAT CG4 LACTIC ACID, ED: Lactic Acid, Venous: 0.89 mmol/L (ref 0.5–2.0)

## 2014-07-09 MED ORDER — CLINDAMYCIN HCL 300 MG PO CAPS: 300.0000 mg | ORAL_CAPSULE | Freq: Four times a day (QID) | ORAL | Status: AC

## 2014-07-09 MED ORDER — IOHEXOL 300 MG/ML  SOLN
75.0000 mL | Freq: Once | INTRAMUSCULAR | Status: AC | PRN
Start: 2014-07-09 — End: 2014-07-09
  Administered 2014-07-09: 75 mL via INTRAVENOUS

## 2014-07-09 MED ORDER — CLINDAMYCIN PHOSPHATE 600 MG/50ML IV SOLN
600.0000 mg | Freq: Once | INTRAVENOUS | Status: AC
  Administered 2014-07-09: 600 mg via INTRAVENOUS
  Filled 2014-07-09: qty 50

## 2014-07-09 MED ORDER — LIDOCAINE HCL 2 % IJ SOLN
5.0000 mL | Freq: Once | INTRAMUSCULAR | Status: AC
  Administered 2014-07-09: 100 mg
  Filled 2014-07-09: qty 20

## 2014-07-09 MED ORDER — MORPHINE SULFATE 4 MG/ML IJ SOLN
4.0000 mg | Freq: Once | INTRAMUSCULAR | Status: AC
  Administered 2014-07-09: 4 mg via INTRAVENOUS
  Filled 2014-07-09: qty 1

## 2014-07-09 NOTE — ED Notes (Signed)
Pt reports that was recently seen here and dx with cellulitis. Wound behind left ear is draining. Has been taking antibiotics. Wife says he has been having chills. Dermatologist he was referred to could not see until May and said he needed PCP referral.

## 2014-07-09 NOTE — ED Notes (Signed)
CT notified patient available for transport.

## 2014-07-09 NOTE — ED Provider Notes (Signed)
CSN: [id number]     Arrival date & time 07/09/14  5176 History   First MD Initiated Contact with Patient 07/09/14 [phone]     Chief Complaint  Patient presents with  . Wound Infection     (Consider location/radiation/quality/duration/timing/severity/associated sxs/prior Treatment) The history is provided by the patient.  [name] is a 69 y.o. male hx of asthma, DM, here with worsening cellulitis. For the last 2 weeks, he noticed a small lump around his left posterior scalp. Came in 4 days ago and was prescribed Bactrim. He states that has not been helping and now there is more redness that's spread to his neck. Also has been having subjective chills but denies any fevers. He also states that he has been more dizzy. Denies any purulent drainage. He is unable to get follow-up with dermatology for another month.    Past Medical History  Diagnosis Date  . Asthma   . Diabetes mellitus    History reviewed. No pertinent past surgical history. Family History  Problem Relation Age of Onset  . Cancer Father   . Diabetes Sister   . Diabetes Brother   . Cancer Mother    History  Substance Use Topics  . Smoking status: Never Smoker   . Smokeless tobacco: Not on file  . Alcohol Use: No    Review of Systems  Constitutional: Positive for chills.  Skin: Positive for rash.  All other systems reviewed and are negative.     Allergies  Review of patient's allergies indicates no known allergies.  Home Medications   Prior to Admission medications   Medication Sig Start Date End Date Taking? Authorizing Provider  aspirin-acetaminophen-caffeine (EXCEDRIN MIGRAINE) [phone] MG per tablet Take 1 tablet by mouth every 6 (six) hours as needed for migraine.   Yes Historical Provider, MD  glipiZIDE (GLUCOTROL) 10 MG 24 hr tablet Take 10 mg by mouth daily.     Yes Historical Provider, MD  metFORMIN (GLUCOPHAGE) 500 MG tablet Take 500 mg by mouth 2 (two) times daily with a meal.   Yes Historical  Provider, MD  sulfamethoxazole-trimethoprim (SEPTRA DS) 800-160 MG per tablet Take 1 tablet by mouth every 12 (twelve) hours. 07/05/14  Yes Jeffrey Hedges, PA-C  traMADol (ULTRAM) 50 MG tablet Take 1 tablet (50 mg total) by mouth every 6 (six) hours as needed. 07/05/14  Yes Jeffrey Hedges, PA-C  Fluticasone-Salmeterol (ADVAIR) 250-50 MCG/DOSE AEPB Inhale 1 puff into the lungs 2 (two) times daily as needed (breathing).    Historical Provider, MD   BP 118/82 mmHg  Pulse 70  Temp(Src) 98.8 F (37.1 C) (Oral)  Resp 18  SpO2 95% Physical Exam  Constitutional: He is oriented to person, place, and time. He appears well-developed and well-nourished.  HENT:  Head: Normocephalic.  Mouth/Throat: Oropharynx is clear and moist.  L posterior scalp with area of cellulitis with scabbing. ? Small fluctuance. Redness extends down to neck with cervical LAD. ? Mastoid tenderness. L TM nl and L ear canal showed no otitis externa   Eyes: Conjunctivae are normal. Pupils are equal, round, and reactive to light.  Neck: Normal range of motion.  Mild L cervical LAD   Cardiovascular: Normal rate, regular rhythm and normal heart sounds.   Pulmonary/Chest: Effort normal and breath sounds normal. No respiratory distress. He has no wheezes. He has no rales.  Abdominal: Soft. Bowel sounds are normal. He exhibits no distension. There is no tenderness. There is no rebound and no guarding.  Musculoskeletal: Normal range of  motion. He exhibits no edema or tenderness.  Neurological: He is alert and oriented to person, place, and time. No cranial nerve deficit. Coordination normal.  Nl finger to nose, nl gait. Nl strength throughout   Skin: Skin is warm and dry.  Psychiatric: He has a normal mood and affect. His behavior is normal. Judgment and thought content normal.  Nursing note and vitals reviewed.   ED Course  Procedures (including critical care time)  INCISION AND DRAINAGE Performed by: Darl Householder, Kayler Rise Consent: Verbal  consent obtained. Risks and benefits: risks, benefits and alternatives were discussed Type: abscess  Body area: L scalp  Anesthesia: local infiltration  Incision was made with a scalpel.  Local anesthetic: lidocaine 2 % no epinephrine  Anesthetic total: 2 ml  Complexity: complex Blunt dissection to break up loculations  Drainage: purulent  Drainage amount: scant  Packing material: none  Patient tolerance: Patient tolerated the procedure well with no immediate complications.     Labs Review Labs Reviewed  CBC WITH DIFFERENTIAL/PLATELET - Abnormal; Notable for the following:    WBC 3.3 (*)    RBC 3.89 (*)    Hemoglobin 11.8 (*)    HCT 34.9 (*)    All other components within normal limits  BASIC METABOLIC PANEL - Abnormal; Notable for the following:    Sodium 134 (*)    Glucose, Bld 277 (*)    GFR calc non Af Amer 64 (*)    GFR calc Af Amer 74 (*)    All other components within normal limits  I-STAT CG4 LACTIC ACID, ED    Imaging Review Ct Soft Tissue Neck W Contrast  07/09/2014   CLINICAL DATA:  Cellulitis of the scalp within the soft tissue wound behind the left ear. Chills and swelling in the left side of the neck.  EXAM: CT NECK WITH CONTRAST  TECHNIQUE: Multidetector CT imaging of the neck was performed using the standard protocol following the bolus administration of intravenous contrast.  CONTRAST:  75mL OMNIPAQUE IOHEXOL 300 MG/ML  SOLN  COMPARISON:  None.  FINDINGS: Soft tissues: There is a focal area of soft tissue swelling of the skin and subcutaneous fat measuring 2 cm in diameter superficial to the left occipital bone, posterior to the left ear and remote from the mastoid air cells. There is focal soft tissue stranding in the underlying subcutaneous fat. The finding is consistent with focal cellulitis. There is no abscess. No reactive adenopathy.  Pharynx and larynx: Normal.  Salivary glands: Normal.  Thyroid: Normal.  Lymph nodes: Normal.  Vascular: Normal.   Limited intracranial: Normal.  Visualized orbits: Normal.  Mastoids and visualized paranasal sinuses: There is partial opacification of the inferior aspect of the left mastoid air cells. Slight mucosal thickening in the left maxillary sinus and in the ethmoid air cells. Middle ear cavities appear normal.  Skeleton: No acute abnormalities. Degenerative disc disease in the mid cervical spine. Multiple missing teeth.  Upper chest: Normal.  IMPRESSION: Focal slight cellulitis in the scalp overlying the left occipital bone. No underlying abscess or adenopathy.   Electronically Signed   By: Lorriane Shire M.D.   On: 07/09/2014 10:57     EKG Interpretation None      MDM   Final diagnoses:  None    [name] is a 69 y.o. male here with worsening cellulitis. Now is over the mastoid and has cervical LAD. Will get CT to r/o mastoiditis vs abscess. Will get labs.   12:09 PM CT showed cellulitis  of scalp. Has small fluctuant area so attempted I and D but scant drainage came out. WBC slightly low. Given clinda and will switch to clinda    Wandra Arthurs, MD 07/09/14 1210

## 2014-07-09 NOTE — Discharge Instructions (Signed)
Stop bactrim.  Take clinda as prescribed.   Follow up with your doctor in a week.   Return to ER if you have fevers, worse swelling, worse dizziness.

## 2014-07-09 NOTE — ED Notes (Signed)
Suture kit at bedside 

## 2014-07-24 DIAGNOSIS — Z1389 Encounter for screening for other disorder: Secondary | ICD-10-CM | POA: Diagnosis not present

## 2014-07-24 DIAGNOSIS — N182 Chronic kidney disease, stage 2 (mild): Secondary | ICD-10-CM | POA: Diagnosis not present

## 2014-07-24 DIAGNOSIS — E1122 Type 2 diabetes mellitus with diabetic chronic kidney disease: Secondary | ICD-10-CM | POA: Diagnosis not present

## 2014-07-24 DIAGNOSIS — Z0000 Encounter for general adult medical examination without abnormal findings: Secondary | ICD-10-CM | POA: Diagnosis not present

## 2014-07-24 DIAGNOSIS — L03811 Cellulitis of head [any part, except face]: Secondary | ICD-10-CM | POA: Diagnosis not present

## 2014-07-24 DIAGNOSIS — Z8546 Personal history of malignant neoplasm of prostate: Secondary | ICD-10-CM | POA: Diagnosis not present

## 2014-07-24 DIAGNOSIS — Z23 Encounter for immunization: Secondary | ICD-10-CM | POA: Diagnosis not present

## 2014-07-24 DIAGNOSIS — I10 Essential (primary) hypertension: Secondary | ICD-10-CM | POA: Diagnosis not present

## 2014-12-11 ENCOUNTER — Encounter (HOSPITAL_COMMUNITY): Payer: Self-pay | Admitting: General Practice

## 2014-12-11 ENCOUNTER — Emergency Department (HOSPITAL_COMMUNITY)
Admission: EM | Admit: 2014-12-11 | Discharge: 2014-12-11 | Disposition: A | Discharge status: Home / Self Care | Payer: Commercial Managed Care - HMO | Attending: Emergency Medicine | Admitting: Emergency Medicine

## 2014-12-11 DIAGNOSIS — R05 Cough: Secondary | ICD-10-CM | POA: Diagnosis present

## 2014-12-11 DIAGNOSIS — J45909 Unspecified asthma, uncomplicated: Secondary | ICD-10-CM | POA: Insufficient documentation

## 2014-12-11 DIAGNOSIS — E119 Type 2 diabetes mellitus without complications: Secondary | ICD-10-CM | POA: Insufficient documentation

## 2014-12-11 DIAGNOSIS — Z792 Long term (current) use of antibiotics: Secondary | ICD-10-CM | POA: Insufficient documentation

## 2014-12-11 DIAGNOSIS — H6692 Otitis media, unspecified, left ear: Secondary | ICD-10-CM | POA: Insufficient documentation

## 2014-12-11 DIAGNOSIS — Z79899 Other long term (current) drug therapy: Secondary | ICD-10-CM | POA: Insufficient documentation

## 2014-12-11 DIAGNOSIS — J069 Acute upper respiratory infection, unspecified: Secondary | ICD-10-CM | POA: Insufficient documentation

## 2014-12-11 MED ORDER — AMOXICILLIN 500 MG PO CAPS: 500.0000 mg | ORAL_CAPSULE | Freq: Two times a day (BID) | ORAL | Status: AC

## 2014-12-11 MED ORDER — OXYMETAZOLINE HCL 0.05 % NA SOLN: 1.0000 | Freq: Two times a day (BID) | NASAL | Status: AC

## 2014-12-11 NOTE — ED Notes (Signed)
Pt complaining of an left ear ache, cough, sneezing, chills, that started last Friday. Pt is A/O. Pt denies CP, SOB.

## 2014-12-11 NOTE — ED Provider Notes (Signed)
CSN: [id number]     Arrival date & time 12/11/14  0749 History   First MD Initiated Contact with Patient 12/11/14 [phone]     Chief Complaint  Patient presents with  . URI     (Consider location/radiation/quality/duration/timing/severity/associated sxs/prior Treatment) HPI Comments: Pt is a 70 yo male with history of DM-2 who presents to the ED with complaint of cold sxs, onset 1 week. Pt reports he has had a dry cough, left ear pain and chills since last Friday. Denies fever, headache, decreased hearing, ear drainage, sore throat, nasal congestion, rhinorrhea, SOB, CP, abdominal pain, N/V/D, urinary sxs, weakness. He states he has been taking nyquil at home with mild relief of sxs.   Patient is a 69 y.o. male presenting with URI.  URI Presenting symptoms: cough and ear pain     Past Medical History  Diagnosis Date  . Asthma   . Diabetes mellitus    History reviewed. No pertinent past surgical history. Family History  Problem Relation Age of Onset  . Cancer Father   . Diabetes Sister   . Diabetes Brother   . Cancer Mother    Social History  Substance Use Topics  . Smoking status: Never Smoker   . Smokeless tobacco: None  . Alcohol Use: No    Review of Systems  Constitutional: Positive for chills.  HENT: Positive for ear pain.   Respiratory: Positive for cough.   All other systems reviewed and are negative.     Allergies  Review of patient's allergies indicates no known allergies.  Home Medications   Prior to Admission medications   Medication Sig Start Date End Date Taking? Authorizing Deepti Gunawan  aspirin-acetaminophen-caffeine (EXCEDRIN MIGRAINE) [phone] MG per tablet Take 1 tablet by mouth every 6 (six) hours as needed for migraine.    Historical Sahian Kerney, MD  clindamycin (CLEOCIN) 300 MG capsule Take 1 capsule (300 mg total) by mouth 4 (four) times daily. X 7 days 07/09/14   Wandra Arthurs, MD  Fluticasone-Salmeterol (ADVAIR) 250-50 MCG/DOSE AEPB Inhale 1 puff into  the lungs 2 (two) times daily as needed (breathing).    Historical Chelsae Zanella, MD  glipiZIDE (GLUCOTROL) 10 MG 24 hr tablet Take 10 mg by mouth daily.      Historical Santosha Jividen, MD  metFORMIN (GLUCOPHAGE) 500 MG tablet Take 500 mg by mouth 2 (two) times daily with a meal.    Historical Ransome Helwig, MD  sulfamethoxazole-trimethoprim (SEPTRA DS) 800-160 MG per tablet Take 1 tablet by mouth every 12 (twelve) hours. 07/05/14   Okey Regal, PA-C  traMADol (ULTRAM) 50 MG tablet Take 1 tablet (50 mg total) by mouth every 6 (six) hours as needed. 07/05/14   Jeffrey Hedges, PA-C   BP 182/95 mmHg  Pulse 82  Temp(Src) 98.2 F (36.8 C) (Oral)  Resp 10  Ht 6\' 1"  (1.854 m)  Wt 210 lb (95.255 kg)  BMI 27.71 kg/m2  SpO2 98% Physical Exam  Constitutional: He is oriented to person, place, and time. He appears well-developed and well-nourished.  HENT:  Head: Normocephalic and atraumatic.  Right Ear: Tympanic membrane and external ear normal.  Left Ear: External ear normal. No drainage or swelling. No mastoid tenderness. Tympanic membrane is erythematous and bulging.  No middle ear effusion. No decreased hearing is noted.  Nose: Nose normal. Right sinus exhibits no maxillary sinus tenderness and no frontal sinus tenderness. Left sinus exhibits no maxillary sinus tenderness and no frontal sinus tenderness.  Mouth/Throat: Uvula is midline, oropharynx is clear and moist and  mucous membranes are normal. No oropharyngeal exudate.  Eyes: Conjunctivae and EOM are normal. Right eye exhibits no discharge. Left eye exhibits no discharge. No scleral icterus.  Neck: Normal range of motion. Neck supple.  Cardiovascular: Normal rate, regular rhythm, normal heart sounds and intact distal pulses.   Pulmonary/Chest: Effort normal and breath sounds normal. He has no wheezes. He has no rales. He exhibits no tenderness.  Abdominal: Soft. Bowel sounds are normal. He exhibits no mass. There is no tenderness. There is no rebound and no  guarding.  Musculoskeletal: He exhibits no edema.  Lymphadenopathy:    He has no cervical adenopathy.  Neurological: He is alert and oriented to person, place, and time.  Skin: Skin is warm and dry.  Nursing note and vitals reviewed.   ED Course  Procedures (including critical care time) Labs Review Labs Reviewed - No data to display  Imaging Review No results found. I have personally reviewed and evaluated these images and lab results as part of my medical decision-making.  Filed Vitals:   12/11/14 0758  BP: 182/95  Pulse: 82  Temp: 98.2 F (36.8 C)  Resp: 10     MDM   Final diagnoses:  Acute left otitis media, recurrence not specified, unspecified otitis media type  URI (upper respiratory infection)    Pt presents with left ear pain, chills and dry cough. Mild relief of sxs at home with Nyquil. VSS. Erythematous bulging left TM on exam, lungs CTAB. Plan to d/c pt home with amoxicillin and decongestant. Pt advised to take tylenol for pain relief as needed. Advised pt to follow up with PCP in 3 days.   Evaluation does not show pathology requring ongoing emergent intervention or admission. Pt is hemodynamically stable and mentating appropriately. Discussed findings/results and plan with patient/guardian, who agrees with plan. All questions answered. Return precautions discussed and outpatient follow up given.       Chesley Noon [place], [place] 12/11/14 3428  Pattricia Boss, MD 12/11/14 [phone]

## 2014-12-11 NOTE — Discharge Instructions (Signed)
Please take your prescriptions for amoxicillin and afrain as prescribed. You may also take tylenol as prescribed for pain relief.  Please follow up with your primary care provider in 3 days. Please return to the Emergency Department if symptoms worsen or new onset of fever, productive cough, abdominal pain, nausea, vomiting, ear drainage.

## 2014-12-21 DIAGNOSIS — H6690 Otitis media, unspecified, unspecified ear: Secondary | ICD-10-CM | POA: Diagnosis not present
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
## 2016-06-19 DIAGNOSIS — N182 Chronic kidney disease, stage 2 (mild): Secondary | ICD-10-CM | POA: Diagnosis not present

## 2016-06-19 DIAGNOSIS — Z8546 Personal history of malignant neoplasm of prostate: Secondary | ICD-10-CM | POA: Diagnosis not present

## 2016-06-19 DIAGNOSIS — I10 Essential (primary) hypertension: Secondary | ICD-10-CM | POA: Diagnosis not present

## 2016-06-19 DIAGNOSIS — E1122 Type 2 diabetes mellitus with diabetic chronic kidney disease: Secondary | ICD-10-CM | POA: Diagnosis not present

## 2016-06-19 DIAGNOSIS — E1142 Type 2 diabetes mellitus with diabetic polyneuropathy: Secondary | ICD-10-CM | POA: Diagnosis not present

## 2016-06-19 DIAGNOSIS — Z7984 Long term (current) use of oral hypoglycemic drugs: Secondary | ICD-10-CM | POA: Diagnosis not present

## 2016-11-10 DIAGNOSIS — R030 Elevated blood-pressure reading, without diagnosis of hypertension: Secondary | ICD-10-CM | POA: Diagnosis not present

## 2016-11-10 DIAGNOSIS — E119 Type 2 diabetes mellitus without complications: Secondary | ICD-10-CM | POA: Diagnosis not present

## 2016-11-10 DIAGNOSIS — Z8546 Personal history of malignant neoplasm of prostate: Secondary | ICD-10-CM | POA: Diagnosis not present

## 2016-11-10 DIAGNOSIS — S80862A Insect bite (nonvenomous), left lower leg, initial encounter: Secondary | ICD-10-CM | POA: Diagnosis not present

## 2016-11-10 DIAGNOSIS — W57XXXA Bitten or stung by nonvenomous insect and other nonvenomous arthropods, initial encounter: Secondary | ICD-10-CM | POA: Diagnosis not present

## 2016-11-10 DIAGNOSIS — I10 Essential (primary) hypertension: Secondary | ICD-10-CM | POA: Diagnosis not present

## 2016-12-16 DIAGNOSIS — S40861A Insect bite (nonvenomous) of right upper arm, initial encounter: Secondary | ICD-10-CM | POA: Diagnosis not present

## 2016-12-16 DIAGNOSIS — W57XXXA Bitten or stung by nonvenomous insect and other nonvenomous arthropods, initial encounter: Secondary | ICD-10-CM | POA: Diagnosis not present

## 2016-12-16 DIAGNOSIS — T63421A Toxic effect of venom of ants, accidental (unintentional), initial encounter: Secondary | ICD-10-CM | POA: Diagnosis not present

## 2017-02-19 DIAGNOSIS — M24431 Recurrent dislocation, right wrist: Secondary | ICD-10-CM | POA: Diagnosis not present

## 2017-02-19 DIAGNOSIS — M25531 Pain in right wrist: Secondary | ICD-10-CM | POA: Diagnosis not present

## 2017-09-04 DIAGNOSIS — S91312A Laceration without foreign body, left foot, initial encounter: Secondary | ICD-10-CM | POA: Diagnosis not present

## 2017-09-04 DIAGNOSIS — I10 Essential (primary) hypertension: Secondary | ICD-10-CM | POA: Diagnosis not present

## 2017-09-04 DIAGNOSIS — J45909 Unspecified asthma, uncomplicated: Secondary | ICD-10-CM | POA: Diagnosis not present

## 2017-09-04 DIAGNOSIS — W228XXA Striking against or struck by other objects, initial encounter: Secondary | ICD-10-CM | POA: Diagnosis not present

## 2017-09-04 DIAGNOSIS — Z043 Encounter for examination and observation following other accident: Secondary | ICD-10-CM | POA: Diagnosis not present

## 2017-09-04 DIAGNOSIS — E119 Type 2 diabetes mellitus without complications: Secondary | ICD-10-CM | POA: Diagnosis not present

## 2017-12-10 DIAGNOSIS — G629 Polyneuropathy, unspecified: Secondary | ICD-10-CM | POA: Diagnosis not present

## 2017-12-10 DIAGNOSIS — Z23 Encounter for immunization: Secondary | ICD-10-CM | POA: Diagnosis not present

## 2017-12-10 DIAGNOSIS — C61 Malignant neoplasm of prostate: Secondary | ICD-10-CM | POA: Diagnosis not present

## 2017-12-10 DIAGNOSIS — E119 Type 2 diabetes mellitus without complications: Secondary | ICD-10-CM | POA: Diagnosis not present

## 2017-12-31 DIAGNOSIS — E119 Type 2 diabetes mellitus without complications: Secondary | ICD-10-CM | POA: Diagnosis not present

## 2018-02-07 DIAGNOSIS — E113511 Type 2 diabetes mellitus with proliferative diabetic retinopathy with macular edema, right eye: Secondary | ICD-10-CM | POA: Diagnosis not present

## 2018-02-07 DIAGNOSIS — Z794 Long term (current) use of insulin: Secondary | ICD-10-CM | POA: Diagnosis not present

## 2018-02-07 DIAGNOSIS — Z961 Presence of intraocular lens: Secondary | ICD-10-CM | POA: Diagnosis not present

## 2018-02-07 DIAGNOSIS — E113392 Type 2 diabetes mellitus with moderate nonproliferative diabetic retinopathy without macular edema, left eye: Secondary | ICD-10-CM | POA: Diagnosis not present

## 2018-03-07 DIAGNOSIS — E113411 Type 2 diabetes mellitus with severe nonproliferative diabetic retinopathy with macular edema, right eye: Secondary | ICD-10-CM | POA: Diagnosis not present
